# Patient Record
Sex: Male | Born: 1995
Health system: Southern US, Community
[De-identification: ages and names within clinical notes are randomized; demographics above are authoritative.]

## PROBLEM LIST (undated history)

## (undated) DIAGNOSIS — J302 Other seasonal allergic rhinitis: Secondary | ICD-10-CM

## (undated) HISTORY — PX: HAND SURGERY: SHX662

## (undated) HISTORY — PX: OTHER SURGICAL HISTORY: SHX169

---
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
## 2018-12-04 DIAGNOSIS — J22 Unspecified acute lower respiratory infection: Secondary | ICD-10-CM | POA: Diagnosis not present

## 2018-12-04 DIAGNOSIS — J101 Influenza due to other identified influenza virus with other respiratory manifestations: Secondary | ICD-10-CM | POA: Diagnosis not present

## 2020-04-23 DIAGNOSIS — Z1389 Encounter for screening for other disorder: Secondary | ICD-10-CM | POA: Diagnosis not present

## 2020-04-23 DIAGNOSIS — Z6828 Body mass index (BMI) 28.0-28.9, adult: Secondary | ICD-10-CM | POA: Diagnosis not present

## 2020-04-23 DIAGNOSIS — E663 Overweight: Secondary | ICD-10-CM | POA: Diagnosis not present

## 2020-04-23 DIAGNOSIS — Z0001 Encounter for general adult medical examination with abnormal findings: Secondary | ICD-10-CM | POA: Diagnosis not present

## 2020-06-26 ENCOUNTER — Other Ambulatory Visit (HOSPITAL_COMMUNITY): Payer: Self-pay | Admitting: Family Medicine

## 2020-06-26 ENCOUNTER — Ambulatory Visit (HOSPITAL_COMMUNITY)
Admission: RE | Admit: 2020-06-26 | Discharge: 2020-06-26 | Disposition: A | Discharge status: Home / Self Care | Payer: BC Managed Care – PPO | Source: Ambulatory Visit | Attending: Family Medicine | Admitting: Family Medicine

## 2020-06-26 ENCOUNTER — Other Ambulatory Visit: Payer: Self-pay

## 2020-06-26 DIAGNOSIS — Z6828 Body mass index (BMI) 28.0-28.9, adult: Secondary | ICD-10-CM | POA: Diagnosis not present

## 2020-06-26 DIAGNOSIS — M2140 Flat foot [pes planus] (acquired), unspecified foot: Secondary | ICD-10-CM | POA: Diagnosis not present

## 2020-06-26 DIAGNOSIS — M79641 Pain in right hand: Secondary | ICD-10-CM

## 2020-06-26 DIAGNOSIS — S86899A Other injury of other muscle(s) and tendon(s) at lower leg level, unspecified leg, initial encounter: Secondary | ICD-10-CM | POA: Diagnosis not present

## 2020-06-26 DIAGNOSIS — S6991XA Unspecified injury of right wrist, hand and finger(s), initial encounter: Secondary | ICD-10-CM | POA: Diagnosis not present

## 2020-07-22 DIAGNOSIS — Z0000 Encounter for general adult medical examination without abnormal findings: Secondary | ICD-10-CM | POA: Diagnosis not present

## 2020-07-25 DIAGNOSIS — S86899A Other injury of other muscle(s) and tendon(s) at lower leg level, unspecified leg, initial encounter: Secondary | ICD-10-CM | POA: Diagnosis not present

## 2020-07-25 DIAGNOSIS — M79671 Pain in right foot: Secondary | ICD-10-CM | POA: Diagnosis not present

## 2020-07-25 DIAGNOSIS — M79672 Pain in left foot: Secondary | ICD-10-CM | POA: Diagnosis not present

## 2020-07-25 DIAGNOSIS — M2141 Flat foot [pes planus] (acquired), right foot: Secondary | ICD-10-CM | POA: Diagnosis not present

## 2020-10-27 DIAGNOSIS — Z20822 Contact with and (suspected) exposure to covid-19: Secondary | ICD-10-CM | POA: Diagnosis not present

## 2020-11-04 DIAGNOSIS — Z20822 Contact with and (suspected) exposure to covid-19: Secondary | ICD-10-CM | POA: Diagnosis not present

## 2020-11-17 DIAGNOSIS — Z20822 Contact with and (suspected) exposure to covid-19: Secondary | ICD-10-CM | POA: Diagnosis not present

## 2020-11-18 DIAGNOSIS — Z111 Encounter for screening for respiratory tuberculosis: Secondary | ICD-10-CM | POA: Diagnosis not present

## 2020-11-24 DIAGNOSIS — Z20822 Contact with and (suspected) exposure to covid-19: Secondary | ICD-10-CM | POA: Diagnosis not present

## 2020-12-22 DIAGNOSIS — Z20822 Contact with and (suspected) exposure to covid-19: Secondary | ICD-10-CM | POA: Diagnosis not present

## 2021-01-06 DIAGNOSIS — Z23 Encounter for immunization: Secondary | ICD-10-CM | POA: Diagnosis not present
